# Patient Record
Sex: Male | Born: 1995 | Race: White | Hispanic: No | Marital: Single | Smoking: Never smoker
Health system: Southern US, Community
[De-identification: ages and names within clinical notes are randomized; demographics above are authoritative.]

---
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
## 2016-07-17 ENCOUNTER — Other Ambulatory Visit: Payer: Self-pay | Admitting: Family Medicine

## 2016-07-17 DIAGNOSIS — R591 Generalized enlarged lymph nodes: Secondary | ICD-10-CM

## 2016-07-23 ENCOUNTER — Ambulatory Visit
Admission: RE | Admit: 2016-07-23 | Discharge: 2016-07-23 | Disposition: A | Discharge status: Home / Self Care | Payer: BLUE CROSS/BLUE SHIELD | Source: Ambulatory Visit | Attending: Family Medicine | Admitting: Family Medicine

## 2016-07-23 DIAGNOSIS — R591 Generalized enlarged lymph nodes: Secondary | ICD-10-CM

## 2018-07-17 IMAGING — US US SOFT TISSUE HEAD/NECK
1 series · 8 of 8 positions shown · non-contrast
Comparison: None.

CLINICAL DATA: Cervical lymphadenopathy

EXAM:
ULTRASOUND OF HEAD/NECK SOFT TISSUES
TECHNIQUE: Ultrasound examination of the head and neck soft tissues was
performed in the area of clinical concern.

[Series 1: us soft tissue head/neck · 0.02mm/px · 8 of 8 slices shown]
[im 1/8]
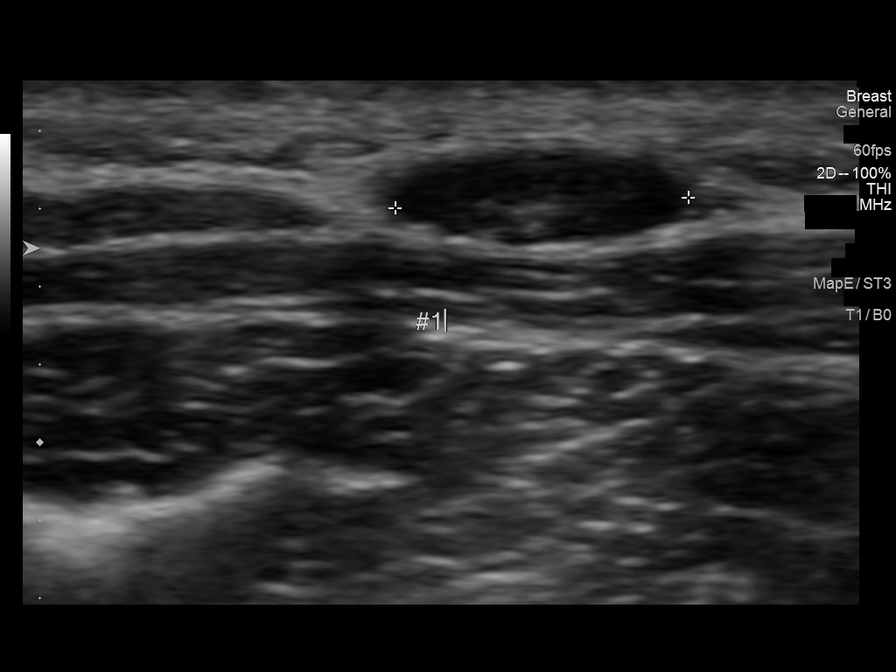
[im 2/8]
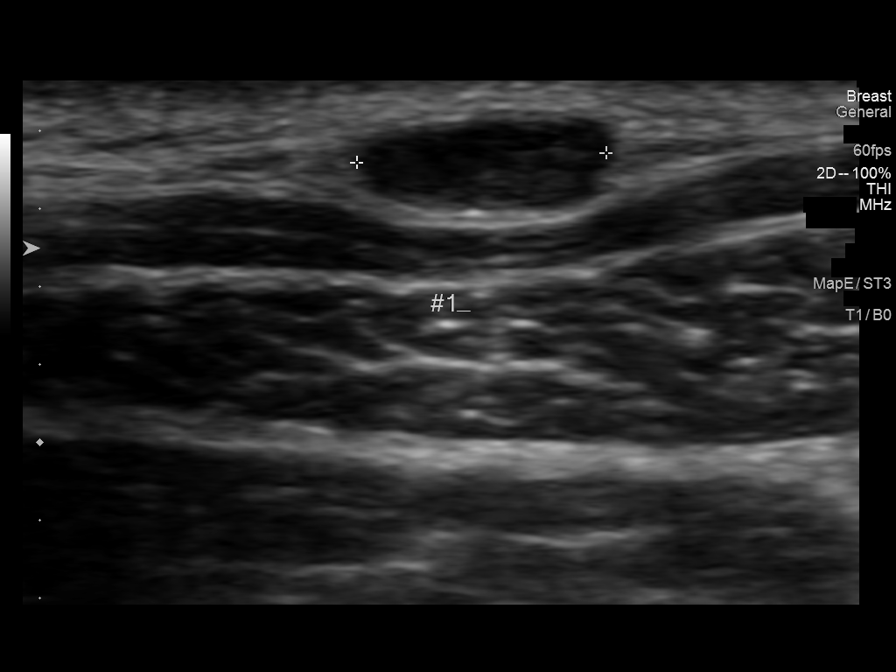
[im 3/8]
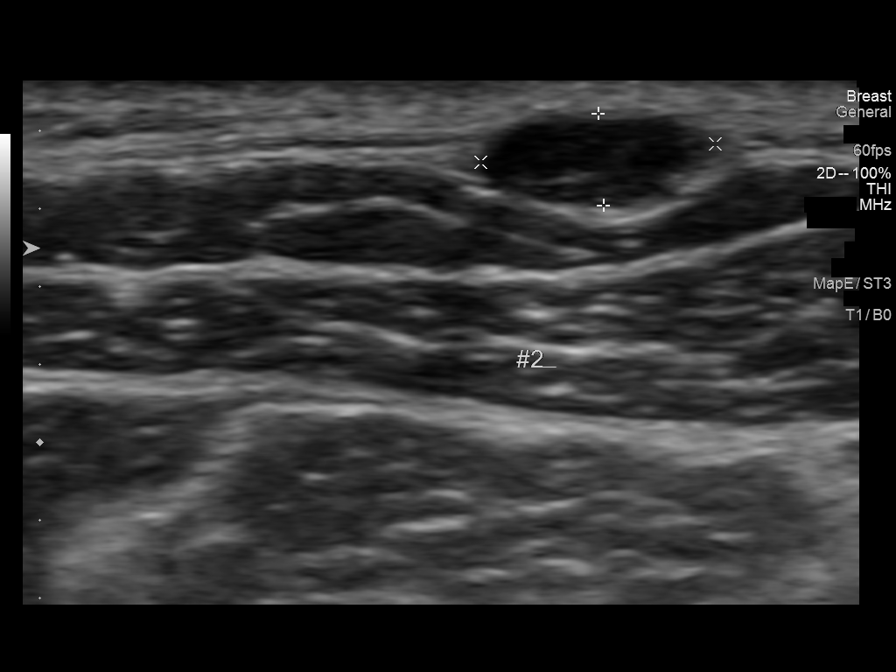
[im 4/8]
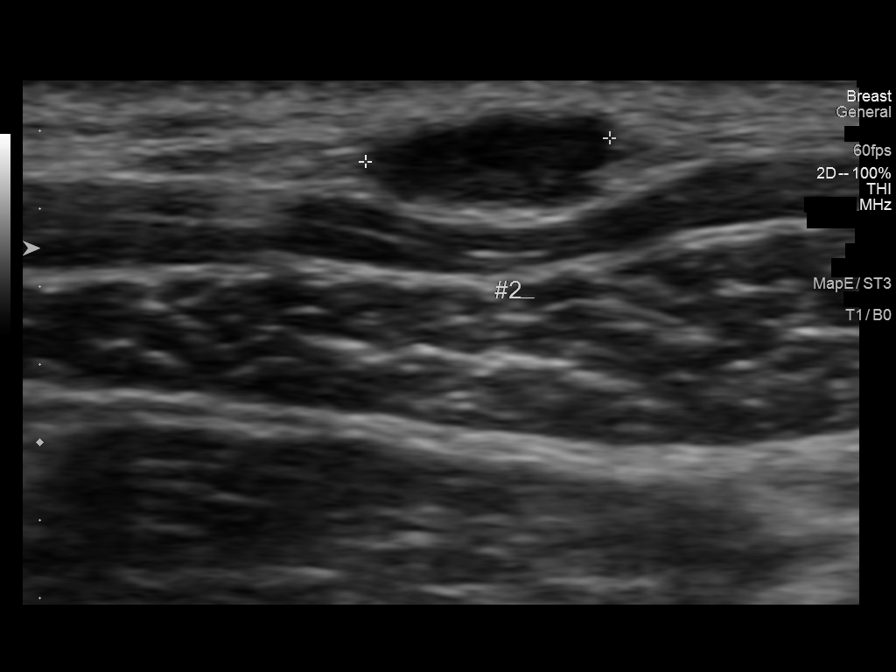
[im 5/8]
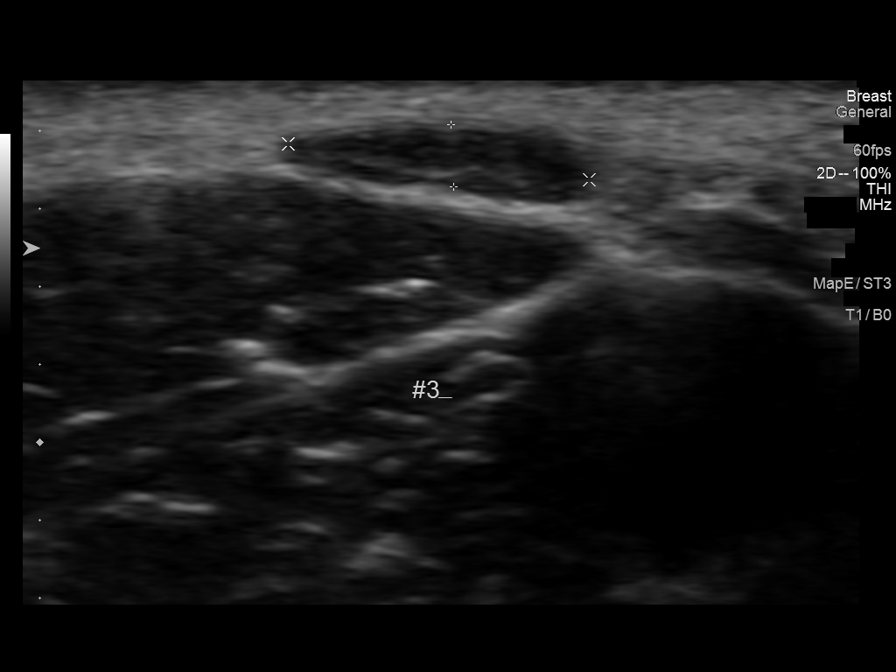
[im 6/8]
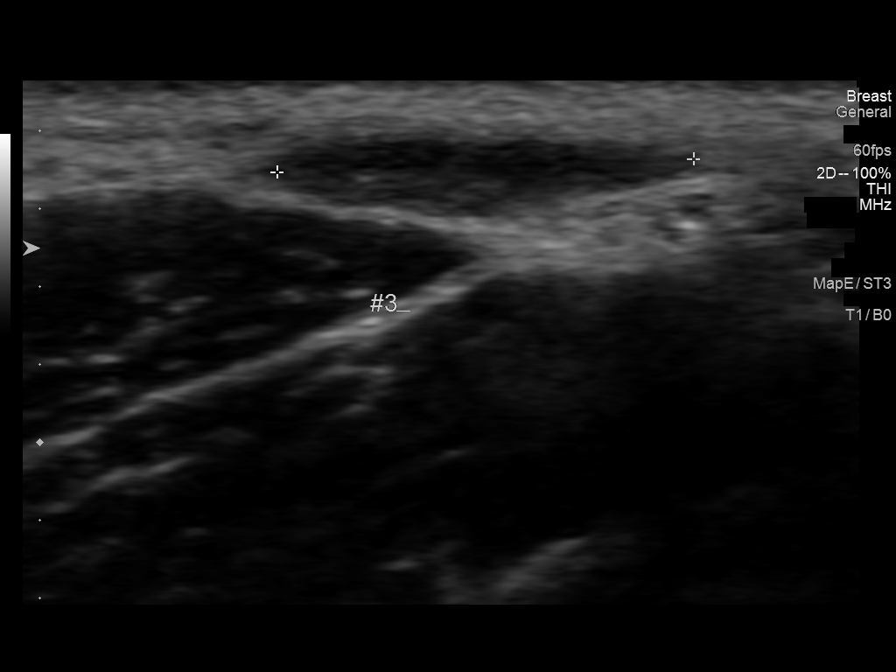
[im 7/8]
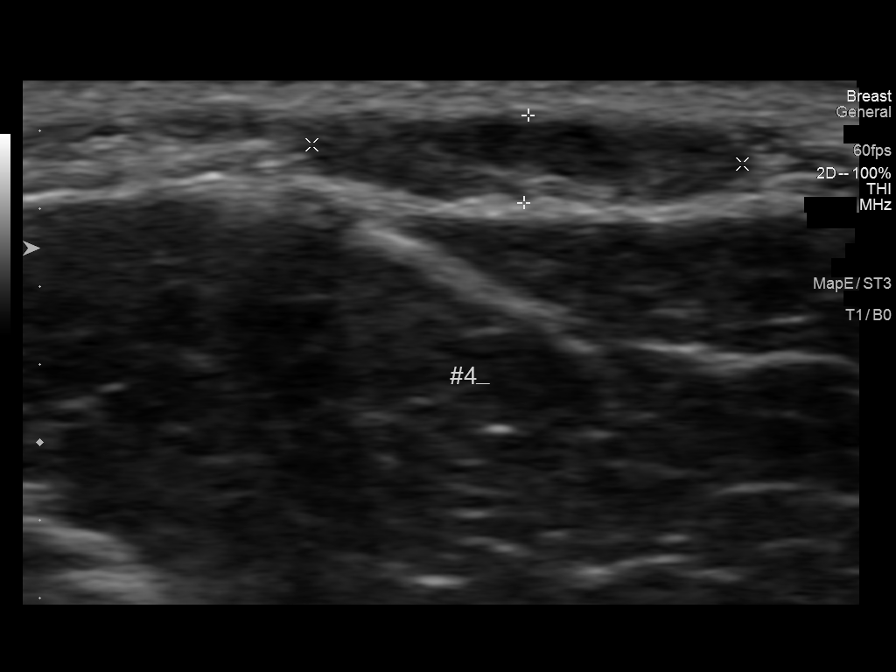
[im 8/8]
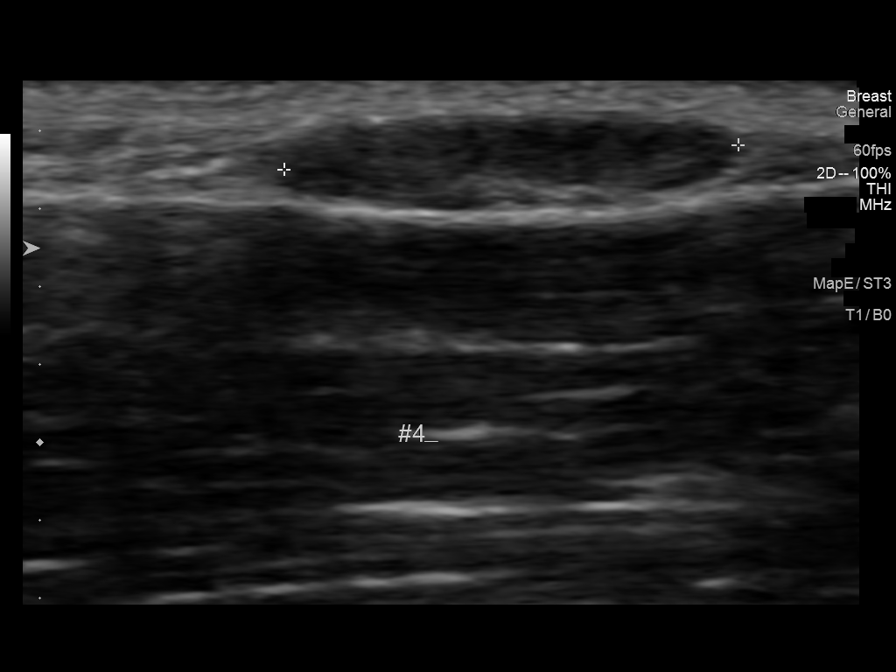

[8 of 8 positions shown; findings below may reference images not displayed]

FINDINGS: Several subcutaneous lymph nodes are identified. The largest short
axis diameter measured is 0.6 cm.
IMPRESSION: Imaging over the right and left side neck reveals non pathologically
enlarged lymph nodes by measurement criteria.

## 2018-08-17 ENCOUNTER — Emergency Department (HOSPITAL_COMMUNITY)
Admission: EM | Admit: 2018-08-17 | Discharge: 2018-08-17 | Disposition: A | Discharge status: Home / Self Care | Payer: BC Managed Care – PPO | Attending: Emergency Medicine | Admitting: Emergency Medicine

## 2018-08-17 ENCOUNTER — Emergency Department (HOSPITAL_COMMUNITY): Payer: BC Managed Care – PPO

## 2018-08-17 ENCOUNTER — Encounter (HOSPITAL_COMMUNITY): Payer: Self-pay

## 2018-08-17 ENCOUNTER — Other Ambulatory Visit: Payer: Self-pay

## 2018-08-17 DIAGNOSIS — J988 Other specified respiratory disorders: Secondary | ICD-10-CM | POA: Diagnosis not present

## 2018-08-17 DIAGNOSIS — R05 Cough: Secondary | ICD-10-CM | POA: Diagnosis present

## 2018-08-17 DIAGNOSIS — B9789 Other viral agents as the cause of diseases classified elsewhere: Secondary | ICD-10-CM

## 2018-08-17 DIAGNOSIS — Z20828 Contact with and (suspected) exposure to other viral communicable diseases: Secondary | ICD-10-CM | POA: Diagnosis not present

## 2018-08-17 NOTE — ED Triage Notes (Signed)
Pt states that he has been having SOB, cough, congestion for the past few weeks, denies fevers.

## 2018-08-17 NOTE — ED Provider Notes (Signed)
[facility] Provider Note   CSN: [id number] Arrival date & time: 08/17/18  0013     History   Chief Complaint Chief Complaint  Patient presents with  . Shortness of Breath    HPI Dustin Grant is a 23 y.o. male.     Patient presents to the emergency department with a chief complaint of cough, congestion, and some shortness of breath.  He has been having the symptoms for the past couple of weeks.  He works as a Loss adjuster, [employer] driver, but denies any known exposures to persons with COVID-19.  He denies any history of PE or DVT.  Denies any lower extremity swelling.  He denies any fevers or chills.  Denies any successful treatments prior to arrival.  The history is provided by the patient. No language interpreter was used.    History reviewed. No pertinent past medical history.  There are no active problems to display for this patient.   History reviewed. No pertinent surgical history.      Home Medications    Prior to Admission medications   Not on File    Family History No family history on file.  Social History Social History   Tobacco Use  . Smoking status: Never Smoker  . Smokeless tobacco: Never Used  Substance Use Topics  . Alcohol use: Never    Frequency: Never  . Drug use: Never     Allergies   Patient has no known allergies.   Review of Systems Review of Systems  All other systems reviewed and are negative.    Physical Exam Updated Vital Signs BP 122/73   Pulse 76   Temp (!) 97.5 F (36.4 C) (Oral)   Resp 13   Ht 6\' 1"  (1.854 m)   Wt 104.3 kg   SpO2 96%   BMI 30.34 kg/m   Physical Exam Vitals signs and nursing note reviewed.  Constitutional:      Appearance: He is well-developed.  HENT:     Head: Normocephalic and atraumatic.  Eyes:     Conjunctiva/sclera: Conjunctivae normal.  Neck:     Musculoskeletal: Neck supple.  Cardiovascular:     Rate and Rhythm: Normal rate and regular rhythm.   Heart sounds: No murmur.  Pulmonary:     Effort: Pulmonary effort is normal. No respiratory distress.     Breath sounds: Normal breath sounds.  Abdominal:     Palpations: Abdomen is soft.     Tenderness: There is no abdominal tenderness.  Skin:    General: Skin is warm and dry.  Neurological:     Mental Status: He is alert.  Psychiatric:        Mood and Affect: Mood normal.        Behavior: Behavior normal.      ED Treatments / Results  Labs (all labs ordered are listed, but only abnormal results are displayed) Labs Reviewed  NOVEL CORONAVIRUS, NAA [facility](HOSPITAL ORDER, SEND-OUT TO REF LAB)    EKG EKG Interpretation  Date/Time:  Wednesday August 17 2018 00:26:28 EDT Ventricular Rate:  79 PR Interval:  138 QRS Duration: 100 QT Interval:  348 QTC Calculation: 399 R Axis:   34 Text Interpretation:  Normal sinus rhythm Confirmed by Nicanor AlconPalumbo, April ([id number]) on 08/17/2018 1:09:49 AM   Radiology Dg Chest 2 View  Result Date: 08/17/2018 CLINICAL DATA:  Shortness of breath EXAM: CHEST - 2 VIEW COMPARISON:  None. FINDINGS: The heart size and mediastinal contours are within normal limits.  Both lungs are clear. The visualized skeletal structures are unremarkable. IMPRESSION: No active cardiopulmonary disease. Electronically Signed   By: Donavan Foil M.D.   On: 08/17/2018 00:38    Procedures Procedures (including critical care time)  Medications Ordered in ED Medications - No data to display   Initial Impression / Assessment and Plan / ED Course  I have reviewed the triage vital signs and the nursing notes.  Pertinent labs & imaging results that were available during my care of the patient were reviewed by me and considered in my medical decision making (see chart for details).       Patient with cough and congestion.  Chest x-ray negative.  EKG shows no ischemic changes.  Vital signs are stable.  O2 saturation is normal.  Patient is in no acute distress.  He is well-appearing.   Will send for send out COVID.  Encouraged patient to isolate until this test results.  Return precautions discussed.  Final Clinical Impressions(s) / ED Diagnoses   Final diagnoses:  Viral respiratory illness    ED Discharge Orders    None       Montine Circle, PA-C 08/17/18 0150    Palumbo, April, MD 08/17/18 [id number]

## 2018-08-17 NOTE — ED Notes (Signed)
Discharge instructions: COVID isolation and test results discussed with pt. Pt verbalized understanding pt to go home with mother.

## 2018-08-17 NOTE — Discharge Instructions (Signed)
You had a COVID test done today.  This test will take about 48 hours to result.  You need to assume that you have coronavirus until the test comes back.  If positive, you need to quarantine/isolate yourself for 14 days.

## 2018-08-18 LAB — NOVEL CORONAVIRUS, NAA (HOSP ORDER, SEND-OUT TO REF LAB; TAT 18-24 HRS): SARS-CoV-2, NAA: NOT DETECTED

## 2019-03-09 ENCOUNTER — Ambulatory Visit: Payer: BC Managed Care – PPO | Attending: Internal Medicine

## 2019-03-09 DIAGNOSIS — Z20822 Contact with and (suspected) exposure to covid-19: Secondary | ICD-10-CM

## 2019-03-10 LAB — NOVEL CORONAVIRUS, NAA: SARS-CoV-2, NAA: NOT DETECTED

## 2019-08-10 ENCOUNTER — Encounter: Payer: Self-pay | Admitting: Podiatry

## 2019-08-10 ENCOUNTER — Other Ambulatory Visit: Payer: Self-pay

## 2019-08-10 ENCOUNTER — Ambulatory Visit (INDEPENDENT_AMBULATORY_CARE_PROVIDER_SITE_OTHER): Payer: BC Managed Care – PPO | Admitting: Podiatry

## 2019-08-10 VITALS — BP 147/98 | HR 74

## 2019-08-10 DIAGNOSIS — B353 Tinea pedis: Secondary | ICD-10-CM | POA: Diagnosis not present

## 2019-08-10 MED ORDER — NAFTIFINE HCL 2 % EX GEL
1.0000 | Freq: Two times a day (BID) | CUTANEOUS | 2 refills | Status: DC
Start: 2019-08-10 — End: 2020-08-28

## 2019-08-10 NOTE — Progress Notes (Signed)
  Subjective:  Patient ID: Dustin Grant, male    DOB: Jun 19, 1995,  MRN: [id number]  Chief Complaint  Patient presents with  . Blister    bilateral between the 1st and 2nd toes    24 y.o. male presents with the above complaint. History confirmed with patient.  Lesions develop spontaneously, they crack open and become quite painful.  Usually not fluid-filled by the time he noticed some.  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  He feels like his feet do sweat quite a bit.  Objective:  Physical Exam: warm, good capillary refill, no trophic changes or ulcerative lesions, normal DP and PT pulses and normal sensory exam.  Bilateral first and second interspace at the digital sulcus there is a dry scaling cracking rash consistent with tinea pedis.  No significant management maceration noted today.  Assessment:   1. Tinea pedis of both feet      Plan:  Patient was evaluated and treated and all questions answered.   Discussed the etiology and treatment options for tinea pedis.  Discussed topical and oral treatment.  Recommended topical treatment with 2% naftifine gel.  This was sent to the patient's pharmacy.  Also discussed appropriate foot hygiene, use of antifungal spray such as Tinactin in shoes, as well as cleaning her foot surfaces such as showers and bathroom floors with bleach.  Also recommended using Drysol if his feet are sweating quite often   Return in about 6 weeks (around 09/21/2019).  Sharl Ma, DPM 08/10/2019

## 2019-08-10 NOTE — Patient Instructions (Signed)

## 2019-09-21 ENCOUNTER — Other Ambulatory Visit: Payer: Self-pay

## 2019-09-21 ENCOUNTER — Encounter: Payer: Self-pay | Admitting: Podiatry

## 2019-09-21 ENCOUNTER — Ambulatory Visit (INDEPENDENT_AMBULATORY_CARE_PROVIDER_SITE_OTHER): Payer: BC Managed Care – PPO | Admitting: Podiatry

## 2019-09-21 DIAGNOSIS — B353 Tinea pedis: Secondary | ICD-10-CM | POA: Diagnosis not present

## 2019-09-21 MED ORDER — ERYTHROMYCIN 2 % EX PADS: MEDICATED_PAD | CUTANEOUS | 2 refills | Status: DC

## 2019-09-21 MED ORDER — FLUCONAZOLE 150 MG PO TABS: 150.0000 mg | ORAL_TABLET | Freq: Every day | ORAL | 0 refills | Status: AC

## 2019-09-21 NOTE — Progress Notes (Signed)
°  Subjective:  Patient ID: Dustin Grant, male    DOB: Oct 12, 1995,  MRN: [id number]  Chief Complaint  Patient presents with   Tinea Pedis     6 wk athletes foot    24 y.o. male presents with the above complaint. History confirmed with patient.  Overall he notes some improvement with naftifine gel treatment.  Objective:  Physical Exam: warm, good capillary refill, no trophic changes or ulcerative lesions, normal DP and PT pulses and normal sensory exam.  Residual dry scaling and cracking, this is worst in the right fourth interspace where there is a fissure in the plantar sulcus  Assessment:   1. Tinea pedis of both feet      Plan:  Patient was evaluated and treated and all questions answered.   -He has had some improvement with naftifine.  We will continue this -Also discussed with him that this could be a multiorganism infection, and erythromycin pads were prescribed for this as well.  He will use 1 in the morning and the naftifine gel in the evening -We will treat as well with oral therapy, and a 14-day course of Diflucan was prescribed   Return in about 6 weeks (around 11/02/2019).  Sharl Ma, DPM 09/21/2019

## 2019-11-02 ENCOUNTER — Ambulatory Visit: Payer: BC Managed Care – PPO | Admitting: Podiatry

## 2020-08-10 IMAGING — DX CHEST - 2 VIEW
2 series · 2 of 2 positions shown · non-contrast
Comparison: None.

CLINICAL DATA: Shortness of breath

EXAM:
CHEST - 2 VIEW

[chest pa]
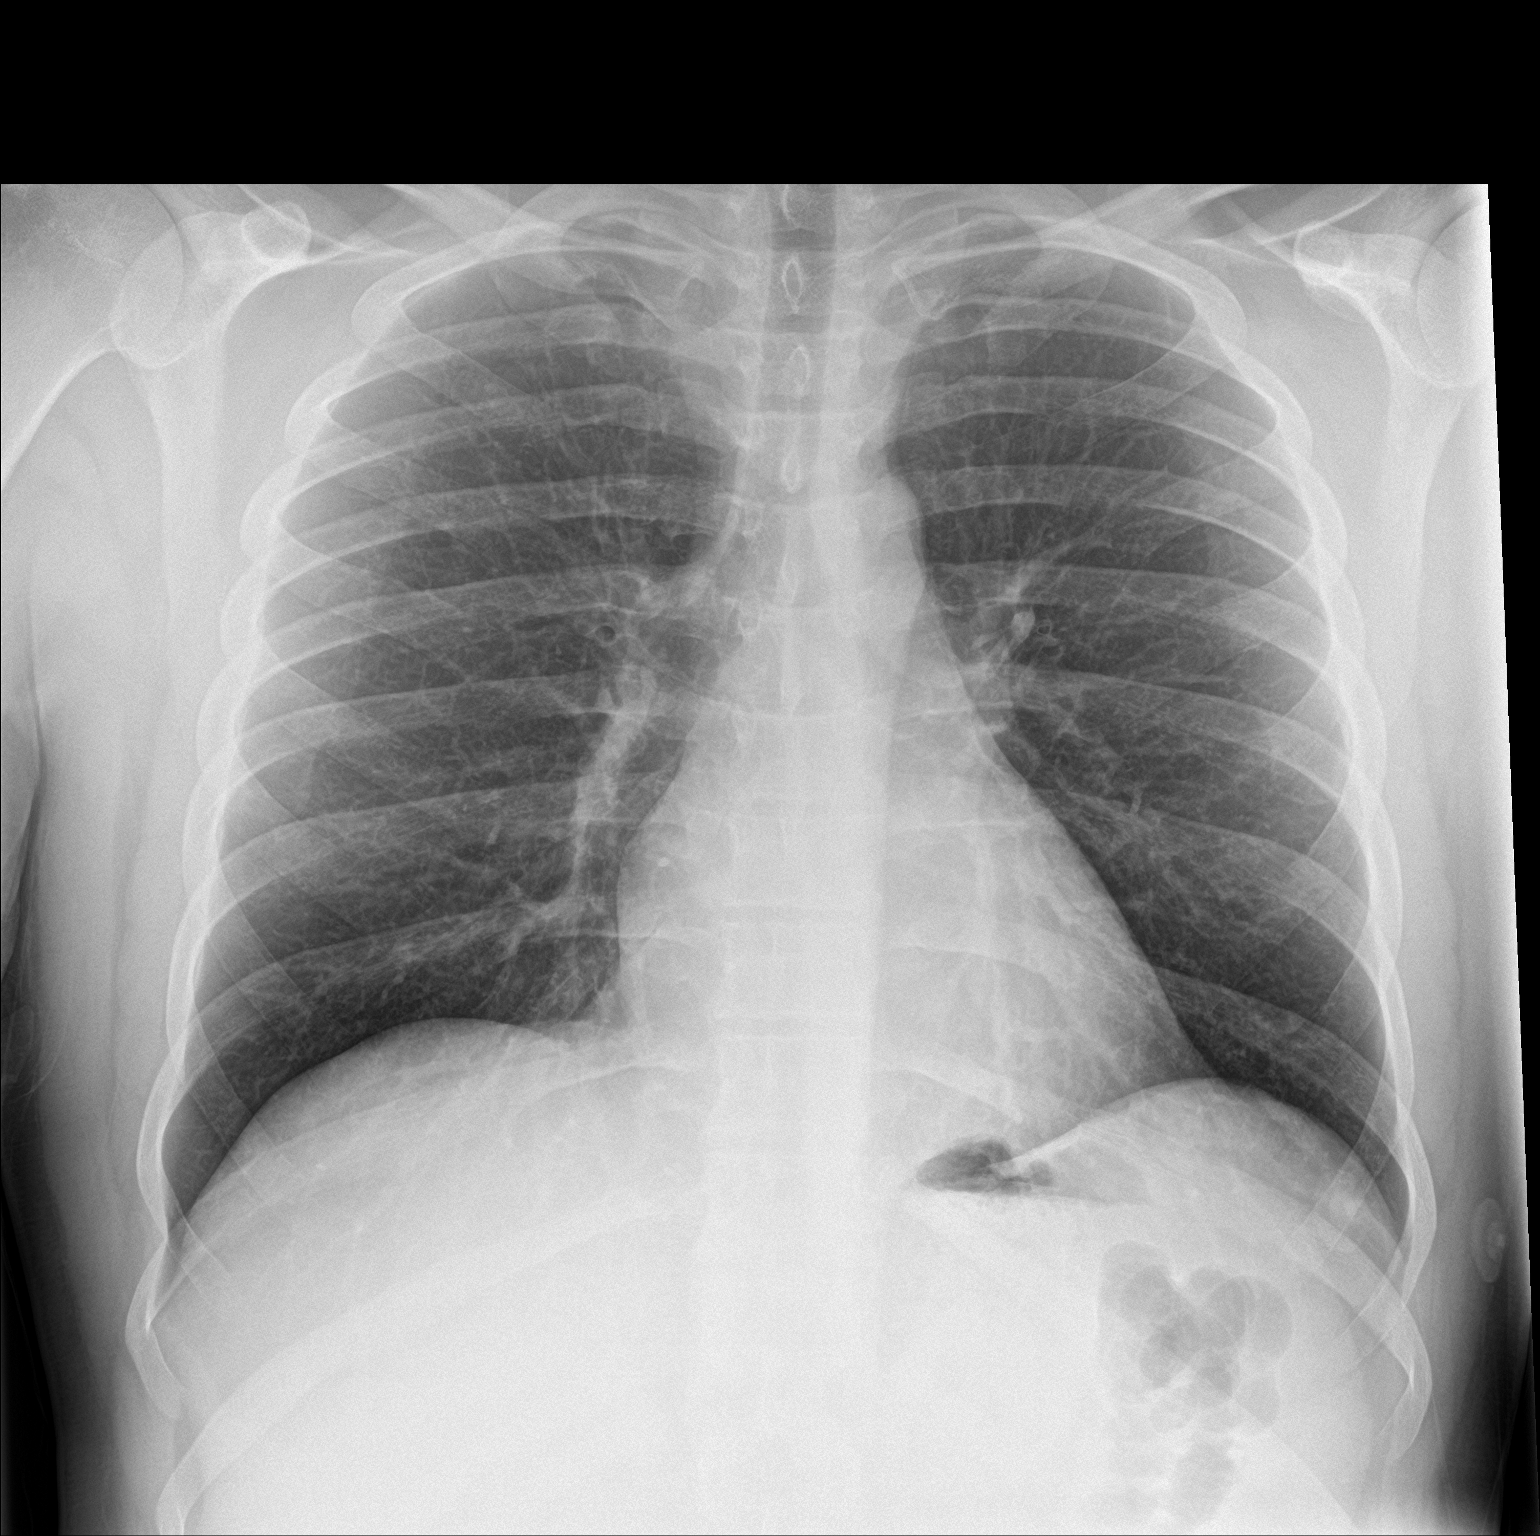

[chest lat]
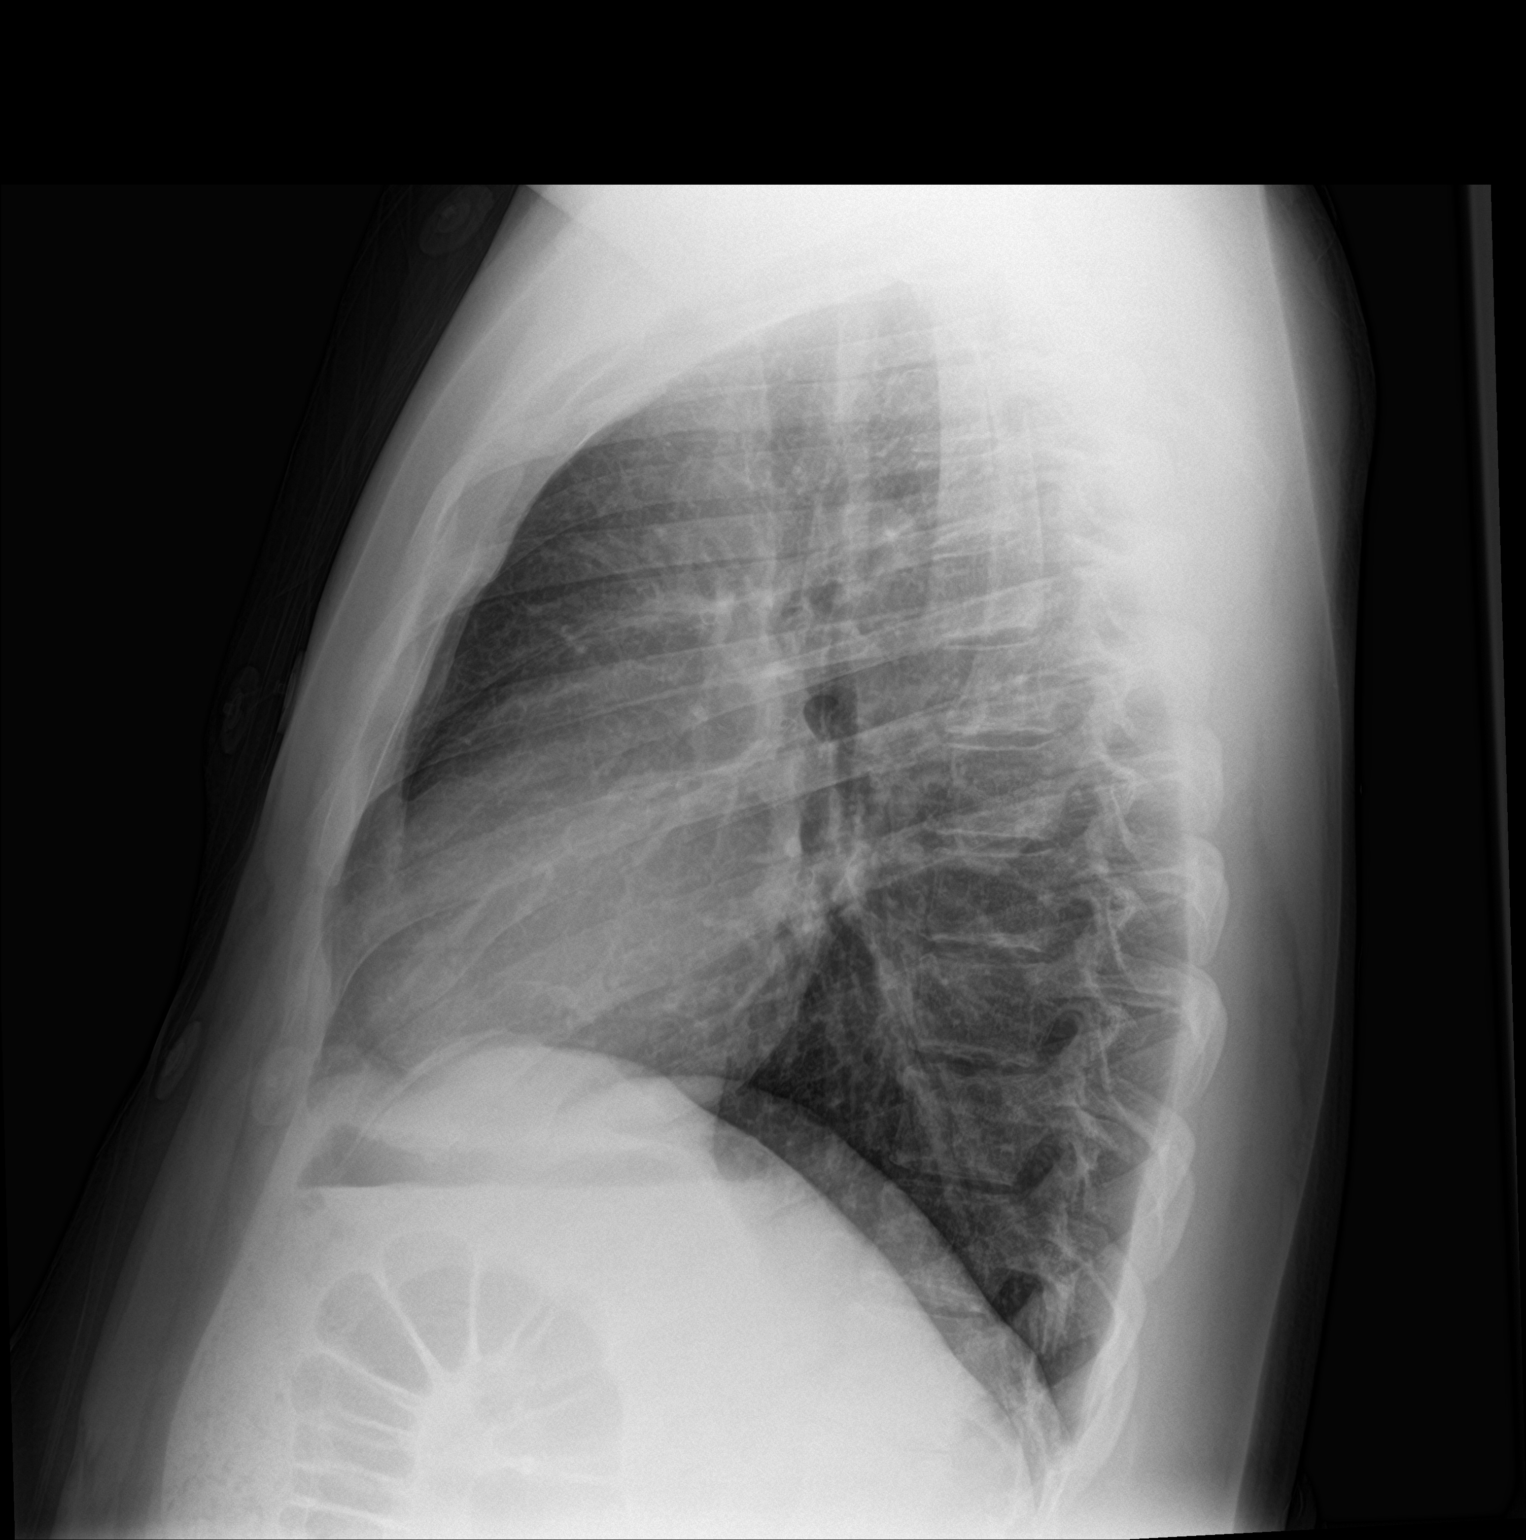

[2 of 2 positions shown; findings below may reference images not displayed]

FINDINGS: The heart size and mediastinal contours are within normal limits.
Both lungs are clear. The visualized skeletal structures are
unremarkable.
IMPRESSION: No active cardiopulmonary disease.

## 2020-08-26 ENCOUNTER — Ambulatory Visit
Admission: EM | Admit: 2020-08-26 | Discharge: 2020-08-26 | Disposition: A | Discharge status: Home / Self Care | Payer: BC Managed Care – PPO | Attending: Family Medicine | Admitting: Family Medicine

## 2020-08-26 ENCOUNTER — Other Ambulatory Visit: Payer: Self-pay

## 2020-08-26 DIAGNOSIS — J069 Acute upper respiratory infection, unspecified: Secondary | ICD-10-CM

## 2020-08-26 DIAGNOSIS — Z1152 Encounter for screening for COVID-19: Secondary | ICD-10-CM | POA: Diagnosis not present

## 2020-08-26 NOTE — Discharge Instructions (Signed)
Recommend Flonase, Mucinex, Delsym as needed for cough Push fluids Rest COVID test results pending.  Isolate until test results are back.  If positive recommended to isolate for 5 days from symptom onset and then wear a mask around others.

## 2020-08-26 NOTE — ED Triage Notes (Signed)
Pt present body aches, coughing and headache. Symptom started on Saturday.  Pt tried otc medication with no relief.

## 2020-08-26 NOTE — ED Provider Notes (Signed)
[facility]    CSN: [id number] Arrival date & time: 08/26/20  1806      History   Chief Complaint Chief Complaint  Patient presents with   Generalized Body Aches   Cough   Headache    HPI Dustin Grant is a 25 y.o. male.   Pt complains of cough, headache, congestion, and fatigue that started three days ago.  He denies fever, chills, n/v, abdominal pain. He has taken delsym for the cough with some relief.  He reports some diarrhea.  Denies known sick contacts, works at [employer].  He has not had his COVID vaccine.    History reviewed. No pertinent past medical history.  There are no problems to display for this patient.   History reviewed. No pertinent surgical history.     Home Medications    Prior to Admission medications   Medication Sig Start Date End Date Taking? Authorizing Provider  Erythromycin 2 % PADS Apply between toes daily 09/21/19   McDonald, Rachelle Hora, DPM  Naftifine HCl 2 % GEL Apply 1 application topically 2 (two) times daily. Apply between toes daily 08/10/19   Edwin Cap, DPM    Family History History reviewed. No pertinent family history.  Social History Social History   Tobacco Use   Smoking status: Never   Smokeless tobacco: Never  Substance Use Topics   Alcohol use: Never   Drug use: Never     Allergies   Patient has no known allergies.   Review of Systems Review of Systems  Constitutional:  Positive for fatigue. Negative for chills and fever.  HENT:  Positive for congestion. Negative for ear pain and sore throat.   Eyes:  Negative for pain and visual disturbance.  Respiratory:  Positive for cough. Negative for shortness of breath and wheezing.   Cardiovascular:  Negative for chest pain and palpitations.  Gastrointestinal:  Positive for diarrhea. Negative for abdominal pain and vomiting.  Genitourinary:  Negative for dysuria and hematuria.  Musculoskeletal:  Negative for arthralgias and back pain.  Skin:  Negative  for color change and rash.  Neurological:  Negative for seizures and syncope.  All other systems reviewed and are negative.   Physical Exam Triage Vital Signs ED Triage Vitals  Enc Vitals Group     BP 08/26/20 1953 132/89     Pulse Rate 08/26/20 1953 95     Resp 08/26/20 1953 18     Temp 08/26/20 1953 98.6 F (37 C)     Temp Source 08/26/20 1953 Oral     SpO2 08/26/20 1953 95 %     Weight --      Height --      Head Circumference --      Peak Flow --      Pain Score 08/26/20 1954 5     Pain Loc --      Pain Edu? --      Excl. in GC? --    No data found.  Updated Vital Signs BP 132/89 (BP Location: Left Arm)   Pulse 95   Temp 98.6 F (37 C) (Oral)   Resp 18   SpO2 95%   Visual Acuity Right Eye Distance:   Left Eye Distance:   Bilateral Distance:    Right Eye Near:   Left Eye Near:    Bilateral Near:     Physical Exam Vitals and nursing note reviewed.  Constitutional:      Appearance: He is well-developed.  HENT:  Head: Normocephalic and atraumatic.  Eyes:     Conjunctiva/sclera: Conjunctivae normal.  Cardiovascular:     Rate and Rhythm: Normal rate and regular rhythm.     Heart sounds: No murmur heard. Pulmonary:     Effort: Pulmonary effort is normal. No respiratory distress.     Breath sounds: Normal breath sounds.  Abdominal:     Palpations: Abdomen is soft.     Tenderness: There is no abdominal tenderness.  Musculoskeletal:     Cervical back: Neck supple.  Skin:    General: Skin is warm and dry.  Neurological:     Mental Status: He is alert.     UC Treatments / Results  Labs (all labs ordered are listed, but only abnormal results are displayed) Labs Reviewed  NOVEL CORONAVIRUS, NAA    EKG   Radiology No results found.  Procedures Procedures (including critical care time)  Medications Ordered in UC Medications - No data to display  Initial Impression / Assessment and Plan / UC Course  I have reviewed the triage vital signs  and the nursing notes.  Pertinent labs & imaging results that were available during my care of the patient were reviewed by me and considered in my medical decision making (see chart for details).     Pt well appearing, in no acute distress, vitals normal.  COVID test pending.  Supportive care recommended.  Return precaution discussed. Work note given. Final Clinical Impressions(s) / UC Diagnoses   Final diagnoses:  Acute upper respiratory infection  Encounter for screening for COVID-19     Discharge Instructions      Recommend Flonase, Mucinex, Delsym as needed for cough Push fluids Rest COVID test results pending.  Isolate until test results are back.  If positive recommended to isolate for 5 days from symptom onset and then wear a mask around others.    ED Prescriptions   None    PDMP not reviewed this encounter.   Jodell Cipro, PA-C 08/26/20 2028

## 2020-08-28 ENCOUNTER — Emergency Department (HOSPITAL_COMMUNITY)
Admission: EM | Admit: 2020-08-28 | Discharge: 2020-08-29 | Disposition: A | Discharge status: Home / Self Care | Payer: BC Managed Care – PPO | Attending: Emergency Medicine | Admitting: Emergency Medicine

## 2020-08-28 ENCOUNTER — Encounter (HOSPITAL_COMMUNITY): Payer: Self-pay

## 2020-08-28 ENCOUNTER — Other Ambulatory Visit: Payer: Self-pay

## 2020-08-28 DIAGNOSIS — U071 COVID-19: Secondary | ICD-10-CM

## 2020-08-28 DIAGNOSIS — R519 Headache, unspecified: Secondary | ICD-10-CM | POA: Diagnosis present

## 2020-08-28 DIAGNOSIS — Z28310 Unvaccinated for covid-19: Secondary | ICD-10-CM | POA: Diagnosis not present

## 2020-08-28 NOTE — ED Triage Notes (Signed)
Pt reports testing positive for COVID and states he is here to "get better".

## 2020-08-29 ENCOUNTER — Emergency Department (HOSPITAL_COMMUNITY): Payer: BC Managed Care – PPO

## 2020-08-29 LAB — BASIC METABOLIC PANEL
Anion gap: 13 (ref 5–15)
BUN: 14 mg/dL (ref 6–20)
CO2: 22 mmol/L (ref 22–32)
Calcium: 9 mg/dL (ref 8.9–10.3)
Chloride: 99 mmol/L (ref 98–111)
Creatinine, Ser: 0.96 mg/dL (ref 0.61–1.24)
GFR, Estimated: >60 mL/min (ref >60)
Glucose, Bld: 102 mg/dL — ABNORMAL HIGH (ref 70–99)
Potassium: 3.5 mmol/L (ref 3.5–5.1)
Sodium: 134 mmol/L — ABNORMAL LOW (ref 135–145)

## 2020-08-29 LAB — NOVEL CORONAVIRUS, NAA: SARS-CoV-2, NAA: DETECTED — AB

## 2020-08-29 MED ORDER — NIRMATRELVIR/RITONAVIR (PAXLOVID)TABLET: 3.0000 | ORAL_TABLET | Freq: Two times a day (BID) | ORAL | 0 refills | Status: AC

## 2020-08-29 MED ORDER — BENZONATATE 200 MG PO CAPS: 200.0000 mg | ORAL_CAPSULE | Freq: Three times a day (TID) | ORAL | 0 refills | Status: AC | PRN

## 2020-08-29 NOTE — ED Provider Notes (Signed)
[facility]-EMERGENCY DEPT Provider Note   CSN: [id number] Arrival date & time: 08/28/20  2158     History Chief Complaint  Patient presents with   Covid Positive    Dustin Grant is a 25 y.o. male.  Patient presents to the emergency department for evaluation of generalized body aches, headache, cough and chest congestion.  Symptoms present for 5 days.  Patient reports that he took a home COVID test yesterday and it was positive.  He has not had any COVID vaccination.      History reviewed. No pertinent past medical history.  There are no problems to display for this patient.   History reviewed. No pertinent surgical history.     History reviewed. No pertinent family history.  Social History   Tobacco Use   Smoking status: Never   Smokeless tobacco: Never  Substance Use Topics   Alcohol use: Never   Drug use: Never    Home Medications Prior to Admission medications   Medication Sig Start Date End Date Taking? Authorizing Provider  benzonatate (TESSALON) 200 MG capsule Take 1 capsule (200 mg total) by mouth 3 (three) times daily as needed for cough. 08/29/20  Yes Cyra Spader, Canary Brim, MD  DEXTROMETHORPHAN POLISTIREX ER PO Take 30 mLs by mouth as needed for cough.   Yes [provider]  nirmatrelvir/ritonavir EUA (PAXLOVID) TABS Take 3 tablets by mouth 2 (two) times daily for 5 days. Patient GFR is >60. Take nirmatrelvir (150 mg) two tablets twice daily for 5 days and ritonavir (100 mg) one tablet twice daily for 5 days. 08/29/20 09/03/20 Yes Semisi Biela, Canary Brim, MD    Allergies    Patient has no known allergies.  Review of Systems   Review of Systems  Constitutional:  Positive for chills and fever.  HENT:  Positive for congestion.   Respiratory:  Positive for cough.   Neurological:  Positive for headaches.  All other systems reviewed and are negative.  Physical Exam Updated Vital Signs BP 131/83   Pulse 93   Temp 98.5 F  (36.9 C) (Oral)   Resp 15   Ht 6' (1.829 m)   SpO2 97%   BMI 31.19 kg/m   Physical Exam Vitals and nursing note reviewed.  Constitutional:      General: He is not in acute distress.    Appearance: Normal appearance. He is well-developed.  HENT:     Head: Normocephalic and atraumatic.     Right Ear: Hearing normal.     Left Ear: Hearing normal.     Nose: Nose normal.  Eyes:     Conjunctiva/sclera: Conjunctivae normal.     Pupils: Pupils are equal, round, and reactive to light.  Cardiovascular:     Rate and Rhythm: Regular rhythm.     Heart sounds: S1 normal and S2 normal. No murmur heard.   No friction rub. No gallop.  Pulmonary:     Effort: Pulmonary effort is normal. No respiratory distress.     Breath sounds: Normal breath sounds.  Chest:     Chest wall: No tenderness.  Abdominal:     General: Bowel sounds are normal.     Palpations: Abdomen is soft.     Tenderness: There is no abdominal tenderness. There is no guarding or rebound. Negative signs include Murphy's sign and McBurney's sign.     Hernia: No hernia is present.  Musculoskeletal:        General: Normal range of motion.  Cervical back: Normal range of motion and neck supple.  Skin:    General: Skin is warm and dry.     Findings: No rash.  Neurological:     Mental Status: He is alert and oriented to person, place, and time.     GCS: GCS eye subscore is 4. GCS verbal subscore is 5. GCS motor subscore is 6.     Cranial Nerves: No cranial nerve deficit.     Sensory: No sensory deficit.     Coordination: Coordination normal.  Psychiatric:        Speech: Speech normal.        Behavior: Behavior normal.        Thought Content: Thought content normal.    ED Results / Procedures / Treatments   Labs (all labs ordered are listed, but only abnormal results are displayed) Labs Reviewed  BASIC METABOLIC PANEL - Abnormal; Notable for the following components:      Result Value   Sodium 134 (*)    Glucose,  Bld 102 (*)    All other components within normal limits    EKG None  Radiology DG Chest Port 1 View  Result Date: 08/29/2020 CLINICAL DATA:  COVID positive with cough and congestion. EXAM: PORTABLE CHEST 1 VIEW COMPARISON:  August 17, 2018 FINDINGS: The heart size and mediastinal contours are within normal limits. Both lungs are clear. The visualized skeletal structures are unremarkable. IMPRESSION: No active cardiopulmonary disease. Electronically Signed   By: Aram Candela M.D.   On: 08/29/2020 03:12    Procedures Procedures   Medications Ordered in ED Medications - No data to display  ED Course  I have reviewed the triage vital signs and the nursing notes.  Pertinent labs & imaging results that were available during my care of the patient were reviewed by me and considered in my medical decision making (see chart for details).    MDM Rules/Calculators/A&P                           Patient with URI symptoms.  He has a positive COVID test at home.  Lungs are clear, no dyspnea, hypoxia.  Chest x-ray clear.  Will initiate Paxlovid as he has not had any vaccinations.  Final Clinical Impression(s) / ED Diagnoses Final diagnoses:  COVID-19    Rx / DC Orders ED Discharge Orders          Ordered    nirmatrelvir/ritonavir EUA (PAXLOVID) TABS  2 times daily        08/29/20 0342    benzonatate (TESSALON) 200 MG capsule  3 times daily PRN        08/29/20 0342             Gilda Crease, MD 08/29/20 [phone]
